# Patient Record
Sex: Male | Born: 1970 | Race: White | Hispanic: No | Smoking: Never smoker
Health system: Southern US, Community
[De-identification: ages and names within clinical notes are randomized; demographics above are authoritative.]

## PROBLEM LIST (undated history)

## (undated) DIAGNOSIS — K429 Umbilical hernia without obstruction or gangrene: Secondary | ICD-10-CM

## (undated) DIAGNOSIS — B1920 Unspecified viral hepatitis C without hepatic coma: Secondary | ICD-10-CM

## (undated) DIAGNOSIS — J309 Allergic rhinitis, unspecified: Secondary | ICD-10-CM

## (undated) HISTORY — PX: INCISION AND DRAINAGE: SHX5863

## (undated) HISTORY — PX: TENDON REPAIR: SHX5111

## (undated) HISTORY — PX: APPENDECTOMY: SHX54

## (undated) HISTORY — PX: OTHER SURGICAL HISTORY: SHX169

## (undated) HISTORY — PX: SHOULDER ARTHROSCOPY: SHX128

## (undated) HISTORY — DX: Hemochromatosis, unspecified: E83.119

---

## 2021-06-01 ENCOUNTER — Other Ambulatory Visit: Payer: Self-pay

## 2021-06-01 ENCOUNTER — Emergency Department (HOSPITAL_COMMUNITY)

## 2021-06-01 ENCOUNTER — Encounter (HOSPITAL_COMMUNITY): Payer: Self-pay | Admitting: *Deleted

## 2021-06-01 ENCOUNTER — Emergency Department (HOSPITAL_COMMUNITY)
Admission: EM | Admit: 2021-06-01 | Discharge: 2021-06-01 | Attending: Emergency Medicine | Admitting: Emergency Medicine

## 2021-06-01 DIAGNOSIS — W208XXA Other cause of strike by thrown, projected or falling object, initial encounter: Secondary | ICD-10-CM | POA: Insufficient documentation

## 2021-06-01 DIAGNOSIS — Y990 Civilian activity done for income or pay: Secondary | ICD-10-CM | POA: Diagnosis not present

## 2021-06-01 DIAGNOSIS — S60222A Contusion of left hand, initial encounter: Secondary | ICD-10-CM

## 2021-06-01 DIAGNOSIS — S6992XA Unspecified injury of left wrist, hand and finger(s), initial encounter: Secondary | ICD-10-CM | POA: Diagnosis present

## 2021-06-01 HISTORY — DX: Umbilical hernia without obstruction or gangrene: K42.9

## 2021-06-01 HISTORY — DX: Unspecified viral hepatitis C without hepatic coma: B19.20

## 2021-06-01 HISTORY — DX: Allergic rhinitis, unspecified: J30.9

## 2021-06-01 MED ORDER — IBUPROFEN 800 MG PO TABS
800.0000 mg | ORAL_TABLET | Freq: Once | ORAL | Status: AC
Start: 2021-06-01 — End: 2021-06-01
  Administered 2021-06-01: 800 mg via ORAL
  Filled 2021-06-01: qty 1

## 2021-06-01 NOTE — ED Triage Notes (Signed)
Left hand swelling, dropped heavy object on hand yesterday ?

## 2021-06-01 NOTE — ED Provider Notes (Signed)
?[facility] EMERGENCY DEPARTMENT ?Provider Note ? ? ?CSN: [id number] ?Arrival date & time: 06/01/21  1606 ? ?  ? ?History ? ?Chief Complaint  ?Patient presents with  ? Hand Injury  ? ? ?Brydon Spahr is a 51 y.o. male. ? ? ?Hand Injury ? ?  ? ? ?Dijuan Sleeth is a 52 y.o. male who is currently an inmate at the local work farm.  He presents to the Emergency Department complaining of injury of the left hand that occurred 1 day prior to arrival.  He states that a heavy piece of equipment fell onto his hand.  He reports swelling mainly on the dorsal surface of his hand and pain along his index finger.  He has increased pain with movement of his fingers and with gripping.  He denies any numbness of his hand or fingers.  No open wounds or injuries of the fingernails.  The left wrist is nontender. ? ? ?Home Medications ?Prior to Admission medications   ?Not on File  ?   ? ?Allergies    ?Keflex [cephalexin]   ? ?Review of Systems   ?Review of Systems  ?Musculoskeletal:  Positive for arthralgias (Left hand pain and swelling).  ?Neurological:  Negative for weakness and numbness.  ?All other systems reviewed and are negative. ? ?Physical Exam ?Updated Vital Signs ?BP 127/76   Pulse 61   Temp 98.2 ?F (36.8 ?C)   Resp 20   Ht 5\' 10"  (1.778 m)   Wt 99.8 kg   SpO2 98%   BMI 31.57 kg/m?  ?Physical Exam ?Vitals and nursing note reviewed.  ?Constitutional:   ?   General: He is not in acute distress. ?   Appearance: Normal appearance. He is not ill-appearing.  ?Cardiovascular:  ?   Rate and Rhythm: Normal rate and regular rhythm.  ?   Pulses: Normal pulses.  ?Pulmonary:  ?   Effort: Pulmonary effort is normal.  ?Musculoskeletal:     ?   General: Swelling, tenderness and signs of injury present. No deformity.  ?   Comments: Diffuse tenderness to palpation of the dorsal aspect of the left hand.  There is some tenderness at the DIP joint of the the left index finger.  No bony deformities.  No injury of the finger nail.  Left wrist  is nontender  ?Skin: ?   General: Skin is warm.  ?   Capillary Refill: Capillary refill takes less than 2 seconds.  ?   Findings: No bruising or erythema.  ?Neurological:  ?   General: No focal deficit present.  ?   Mental Status: He is alert.  ?   Sensory: No sensory deficit.  ?   Motor: No weakness.  ? ? ?ED Results / Procedures / Treatments   ?Labs ?(all labs ordered are listed, but only abnormal results are displayed) ?Labs Reviewed - No data to display ? ?EKG ?None ? ?Radiology ?DG Hand Complete Left ? ?Result Date: 06/01/2021 ?CLINICAL DATA:  Swelling and injury. EXAM: LEFT HAND - COMPLETE 3+ VIEW COMPARISON:  None. FINDINGS: There is diffuse soft tissue swelling of the hand. There is no acute fracture or dislocation identified. There is a small well corticated density along the lateral base of the first metacarpal head likely related to old injury. There is no radiopaque foreign body. Joint spaces are maintained. IMPRESSION: 1. Small density adjacent to the head of the first metacarpal is likely related to old injury. Please correlate for point tenderness. 2. No definite acute fracture or  dislocation. 3. Diffuse soft tissue swelling. Electronically Signed   By: Darliss Cheney M.D.   On: 06/01/2021 16:54   ? ?Procedures ?Procedures  ? ? ?Medications Ordered in ED ?Medications  ?ibuprofen (ADVIL) tablet 800 mg (has no administration in time range)  ? ? ?ED Course/ Medical Decision Making/ A&P ?  ?                        ?Medical Decision Making ?Risk ?Prescription drug management. ? ? ?Patient here for evaluation of injury to the left hand that occurred 1 day prior to arrival.  He has some diffuse tenderness in edema noted of the dorsal hand.  No bony deformities.  There are no subungual hematomas or other injury of the nails.  X-ray of the hand shows soft tissue swelling without evidence of fracture or dislocation.  Compartments are soft.  Neurovascularly intact. ? ?Injuries likely related to contusion,  discussed findings with the patient he is agreeable to symptomatic treatment with bulky dressing applied for support and RICE therapy he will follow-up closely with orthopedics if needed.  Patient appropriate for discharge. ? ? ? ? ? ? ? ?Final Clinical Impression(s) / ED Diagnoses ?Final diagnoses:  ?Contusion of left hand, initial encounter  ? ? ?Rx / DC Orders ?ED Discharge Orders   ? ? None  ? ?  ? ? ?  ?Pauline Aus, PA-C ?06/02/21 2347 ? ?  ?Derwood Kaplan, MD ?06/03/21 2044 ? ?

## 2021-06-01 NOTE — Discharge Instructions (Signed)
The x-ray of your hand did not show evidence of fractures.  I recommend that you elevate your hand when possible and apply ice packs on and off for the next 5 to 7 days or until the swelling improves.  You will need to follow-up with Dr. Mort Sawyers office in 1 week if your symptoms or not improving.  Please give him ibuprofen, 800 mg up to 3 times per day with food as needed for pain ?

## 2021-06-10 ENCOUNTER — Encounter: Payer: Self-pay | Admitting: Orthopedic Surgery

## 2021-06-10 ENCOUNTER — Ambulatory Visit (INDEPENDENT_AMBULATORY_CARE_PROVIDER_SITE_OTHER): Admitting: Orthopedic Surgery

## 2021-06-10 VITALS — BP 132/72 | HR 63 | Ht 70.0 in | Wt 225.0 lb

## 2021-06-10 DIAGNOSIS — S60222A Contusion of left hand, initial encounter: Secondary | ICD-10-CM

## 2021-06-10 DIAGNOSIS — S4492XA Injury of unspecified nerve at shoulder and upper arm level, left arm, initial encounter: Secondary | ICD-10-CM | POA: Insufficient documentation

## 2021-06-10 NOTE — Progress Notes (Signed)
Chief Complaint  ?Patient presents with  ? Hand Injury  ?  Lt hand DOI 05/31/21  ? ? 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  He presents with severe pain and swelling on the dorsum of his hand and decreased range of motion of his fingers with numbness in his thumb index and long finger ? ?He went to the ER the x-rays were negative my independent review is pending below ? ?Past Medical History:  ?Diagnosis Date  ? Allergic rhinitis   ? Hepatitis C   ? Umbilical hernia   ? ? ?BP 132/72   Pulse 63   Ht 5\' 10"  (1.778 m)   Wt 225 lb (102.1 kg)   BMI 32.28 kg/m?  ? ? ?General appearance: Well-developed well-nourished no gross deformities ? ?Cardiovascular normal pulse and perfusion normal color without edema ? ?Neurologically no sensation loss or deficits or pathologic reflexes ? ?Psychological: Awake alert and oriented x3 mood and affect normal ? ?Skin no lacerations or ulcerations no nodularity no palpable masses, no erythema or nodularity ? ?Musculoskeletal: His left hand is severely swollen there is no sign of compartment syndrome but he has pain when he moves his fingers he has decreased sensation in the thumb index and long finger only on the dorsum of the hand he has decreased range of motion tenderness is also noted on the dorsal portion of the hand ? ?Imaging plain films of the hand I have reviewed independently and I read the x-ray as 3 views left hand no fracture dislocation just soft tissue swelling ? ?A/P ? ? ?Encounter Diagnoses  ?Name Primary?  ? 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 Contusion of left hand, initial encounter   ? ?Recommend wrap to control and decrease swelling ? ?Ibuprofen ? ?Occupational Therapy with passive and active assisted range of motion ? ?Follow-up in 4 to 6 weeks ?

## 2021-06-10 NOTE — Patient Instructions (Signed)
Edema glove  ? ?Salt soaks 3 x a day  ? ?Occupational therapy 3 x a week for edema control and AAROM and PROM  ? ?Ibuprofen 800 mg 3 x a day  ?

## 2021-06-22 ENCOUNTER — Telehealth: Payer: Self-pay | Admitting: Orthopedic Surgery

## 2021-06-22 NOTE — Telephone Encounter (Signed)
Called to discuss patient's physical therapy. 3 x a week Active Assisted ROM and Passive ROM ?

## 2021-06-22 NOTE — Telephone Encounter (Signed)
Please call Miss Hurdle at [facility] back, she needs clarification on the Physical Therapy.  Her number is [phone] she will be there till 2:00 today.  ?

## 2021-06-23 NOTE — Telephone Encounter (Signed)
Spoke with Ricky Christian and advised her therapy for 4 weeks to begin with. She stated she is trying to get it approved. She doesn't know if they can do 3 x a week but they will see. Gave her the address and phone number for [facility] ?

## 2021-07-05 ENCOUNTER — Ambulatory Visit (INDEPENDENT_AMBULATORY_CARE_PROVIDER_SITE_OTHER): Admitting: Orthopedic Surgery

## 2021-07-05 DIAGNOSIS — S4492XA Injury of unspecified nerve at shoulder and upper arm level, left arm, initial encounter: Secondary | ICD-10-CM | POA: Diagnosis not present

## 2021-07-05 DIAGNOSIS — S60222D Contusion of left hand, subsequent encounter: Secondary | ICD-10-CM

## 2021-07-05 NOTE — Progress Notes (Signed)
Chief Complaint  ?Patient presents with  ? Hand Pain  ?  Left- still hurts in the two fingers index and long, hasn't gone to therapy been trying to do some on his own ?  ? ? ?Encounter Diagnoses  ?Name Primary?  ? 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 Contusion of left hand, subsequent encounter   ? ? 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 ? ?He still has stiffness in the left hand mainly involving the extension of the MP and PIP joints with pain and swelling on the dorsum of the hand ? ?The patient needs to have occupational therapy. ? ?Once he has had 6 weeks of therapy he can come back for recheck if he has not had the therapy there is no need to bring him back ?

## 2021-07-05 NOTE — Patient Instructions (Addendum)
Arrange follow up after 6 weeks of therapy completed  ?

## 2021-07-21 ENCOUNTER — Telehealth: Payer: Self-pay

## 2021-07-21 NOTE — Telephone Encounter (Signed)
Ms. Ricky Christian with [facility] called stating she had ?been trying to get appointment with [facility] for OT. Stated she was told by Harriett Sine ?that they don't have an order from Dr. Romeo Apple. In his chart I see where  ?Dr. Romeo Apple did want OT setup for him. ? ?Please advise ? ?Ms. Hurtle [phone] ?

## 2021-07-22 ENCOUNTER — Other Ambulatory Visit: Payer: Self-pay

## 2021-07-22 DIAGNOSIS — S4492XA Injury of unspecified nerve at shoulder and upper arm level, left arm, initial encounter: Secondary | ICD-10-CM

## 2021-07-22 NOTE — Telephone Encounter (Signed)
Called to speak with Ms. Hurtle at the prison. An officer answered and stated she was away. Will try again later.  ?

## 2021-07-23 NOTE — Telephone Encounter (Signed)
Called x 2. Rings one time and then gives busy signal.  ?

## 2021-07-23 NOTE — Telephone Encounter (Signed)
Called prison and tried to speak with Ricky Christian. They stated she was gone for the day. Will try again Monday ?

## 2021-07-27 NOTE — Telephone Encounter (Signed)
Spoke with Ricky Christian at prison, physical therapy has been set up for patient. She is aware provider wants to follow up with patient after 6 wks PT for hand ?

## 2021-07-30 ENCOUNTER — Ambulatory Visit (HOSPITAL_COMMUNITY): Attending: Orthopedic Surgery | Admitting: Occupational Therapy

## 2021-07-30 ENCOUNTER — Encounter (HOSPITAL_COMMUNITY): Payer: Self-pay | Admitting: Occupational Therapy

## 2021-07-30 DIAGNOSIS — R29898 Other symptoms and signs involving the musculoskeletal system: Secondary | ICD-10-CM | POA: Diagnosis present

## 2021-07-30 DIAGNOSIS — R600 Localized edema: Secondary | ICD-10-CM | POA: Insufficient documentation

## 2021-07-30 DIAGNOSIS — M79642 Pain in left hand: Secondary | ICD-10-CM | POA: Diagnosis present

## 2021-07-30 NOTE — Therapy (Signed)
OUTPATIENT OCCUPATIONAL THERAPY ORTHO EVALUATION  Patient Name: Ricky Christian MRN: [id number] DOB:09-17-1970, 51 y.o., male Today's Date: 07/30/2021  PCP: none REFERRING PROVIDER: Dr. Fuller Canada   OT End of Session - 07/30/21 1340     Visit Number 1    Number of Visits 1    Date for OT Re-Evaluation 08/02/21    Authorization Type [place] [employer]/STATE [employer]    OT Start Time 1302    OT Stop Time 1320    OT Time Calculation (min) 18 min    Activity Tolerance Patient tolerated treatment well    Behavior During Therapy WFL for tasks assessed/performed             Past Medical History:  Diagnosis Date   Allergic rhinitis    Hepatitis C    Umbilical hernia    History reviewed. No pertinent surgical history. 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 Is a 51 year old male . Presenting with left hand pain and swelling that began approx. 2-3 months ago due to a work injury. Pt. Was referred to occupational therapy for evaluation and treatment by Dr. Romeo Apple.   PRECAUTIONS: None  WEIGHT BEARING RESTRICTIONS No  PAIN:  Are you having pain? Yes: NPRS scale: 2/10 Pain location: left hand Pain description: aching  Aggravating factors: over use of hand Relieving factors: ice   FALLS: Has patient fallen in last 6 months? No  LIVING ENVIRONMENT: Lives with: pt. Currently resides at the [facility]  PLOF: Independent  PATIENT  GOALS to go back to work   OBJECTIVE:   HAND DOMINANCE: Right  ADLs: Overall ADLs: Pt. Is having difficulty with gripping, lifting heavy objects, and performing work tasks.    UPPER EXTREMITY ROM     Pt. Is able to make a full fist.  Active ROM Right eval Left eval  Wrist flexion  68  Wrist extension  65  Wrist ulnar deviation  32  Wrist radial deviation  32  Wrist pronation  90  Wrist supination  90  (Blank rows = not tested)     UPPER EXTREMITY MMT:     MMT Right eval Left eval  Wrist flexion  5/5  Wrist extension  5/5  Wrist ulnar deviation  5/5  Wrist radial deviation  5/5  Wrist pronation  5/5  Wrist supination  5/5  (Blank rows = not tested)  HAND FUNCTION: Grip strength: Right: 75 lbs; Left: 40 lbs, Lateral pinch: Right: 25 lbs, Left: 17 lbs, and 3 point pinch: Right: 22 lbs, Left: 20 lbs    EDEMA: mild edema noted throughout digits and dorsal hand   COGNITION: Overall cognitive status: Within functional limits for tasks assessed   PATIENT EDUCATION: Education details: Higher education careers adviser and digit AROM Person educated: Patient Education method: Explanation, Demonstration, and Handouts Education comprehension: verbalized understanding and returned demonstration   HOME EXERCISE PROGRAM: Eval. Hand and digit ROM   ASSESSMENT:  CLINICAL IMPRESSION: Patient is a 51  y.o. male who was seen today for occupational therapy evaluation for left hand pain and swelling. Pain and swelling has been present for 2-3 months. Reports that swelling has improved since onset of injury. During evaluation mild swelling noted throughout the hand. Pt. With ROM and strength WNL in wrist, hand strength decreased in comparison to dominant hand. After passive stretching, pt. Able to make full fist. Provided small sized edema glove for night time use. Pt. Provided with ROM and strengthening exercises to complete as HEP. No further OT services needed at this time.   PERFORMANCE DEFICITS  in functional skills including IADLs, edema, strength, pain, and UE functional use.  IMPAIRMENTS are limiting patient from ADLs and work.   COMORBIDITIES has no other co-morbidities that affects occupational performance. Patient will benefit from skilled OT to address above impairments and improve overall function.  MODIFICATION OR ASSISTANCE TO COMPLETE EVALUATION: No modification of tasks or assist necessary to complete an evaluation.  OT OCCUPATIONAL PROFILE AND HISTORY: Problem focused assessment: Including review of records relating to presenting problem.  CLINICAL DECISION MAKING: LOW - limited treatment options, no task modification necessary  REHAB POTENTIAL: Good  EVALUATION COMPLEXITY: Low      PLAN: OT FREQUENCY: one time visit  OT DURATION: 1 week  PLANNED INTERVENTIONS: patient/family education  CONSULTED AND AGREED WITH PLAN OF CARE: Patient  PLAN FOR NEXT SESSION: no further OT services required. Pt. Agreeable to HEP. If needs to return in the future, a new referral will be required.   Ezra Sites, OTR/L  [phone] 07/30/21

## 2021-07-30 NOTE — Patient Instructions (Signed)
Complete each exercise 10-15X, 2-3X/day  1) Towel crunch Place a small towel on a firm table top. Flatten out the towel and then place your hand on one end of it.  Next, flex your fingers 2-5 (index finger through pinky finger) as you pull the towel towards your hand.    2) Digit composite flexion/adduction (make a fist) Hold your hand up as shown. Open and close your hand into a fist and repeat. If you cannot make a full fist, then make a partial fist.    3) Thumb/finger opposition Touch the tip of the thumb to each fingertip one by one. Extend fingers fully after they are touched.      4) Finger Taps Start with the hand flat and fingers slightly spread.  One at a time, starting with the thumb, lift each finger up separately.     5) Digit Abduction/Adduction Hold hand palm down flat on table. Spread your fingers apart and back together.   

## 2021-08-26 ENCOUNTER — Emergency Department (HOSPITAL_COMMUNITY)
Admission: EM | Admit: 2021-08-26 | Discharge: 2021-08-26 | Disposition: A | Discharge status: Home / Self Care | Attending: Student | Admitting: Student

## 2021-08-26 ENCOUNTER — Emergency Department (HOSPITAL_COMMUNITY)

## 2021-08-26 ENCOUNTER — Encounter (HOSPITAL_COMMUNITY): Payer: Self-pay | Admitting: *Deleted

## 2021-08-26 ENCOUNTER — Other Ambulatory Visit: Payer: Self-pay

## 2021-08-26 DIAGNOSIS — D61818 Other pancytopenia: Secondary | ICD-10-CM | POA: Diagnosis not present

## 2021-08-26 DIAGNOSIS — R079 Chest pain, unspecified: Secondary | ICD-10-CM | POA: Diagnosis present

## 2021-08-26 DIAGNOSIS — R040 Epistaxis: Secondary | ICD-10-CM | POA: Insufficient documentation

## 2021-08-26 DIAGNOSIS — R519 Headache, unspecified: Secondary | ICD-10-CM | POA: Insufficient documentation

## 2021-08-26 DIAGNOSIS — R739 Hyperglycemia, unspecified: Secondary | ICD-10-CM | POA: Insufficient documentation

## 2021-08-26 DIAGNOSIS — M7981 Nontraumatic hematoma of soft tissue: Secondary | ICD-10-CM | POA: Diagnosis not present

## 2021-08-26 DIAGNOSIS — R350 Frequency of micturition: Secondary | ICD-10-CM | POA: Diagnosis not present

## 2021-08-26 LAB — URINALYSIS, ROUTINE W REFLEX MICROSCOPIC
Bilirubin Urine: NEGATIVE
Glucose, UA: NEGATIVE mg/dL
Hgb urine dipstick: NEGATIVE
Ketones, ur: NEGATIVE mg/dL
Leukocytes,Ua: NEGATIVE
Nitrite: NEGATIVE
Protein, ur: NEGATIVE mg/dL
Specific Gravity, Urine: 1.008 (ref 1.005–1.030)
pH: 7 (ref 5.0–8.0)

## 2021-08-26 LAB — CBC WITH DIFFERENTIAL/PLATELET
Abs Immature Granulocytes: 0 10*3/uL (ref 0.00–0.07)
Basophils Absolute: 0 10*3/uL (ref 0.0–0.1)
Basophils Relative: 1 %
Eosinophils Absolute: 0.1 10*3/uL (ref 0.0–0.5)
Eosinophils Relative: 4 %
HCT: 37.4 % — ABNORMAL LOW (ref 39.0–52.0)
Hemoglobin: 12.5 g/dL — ABNORMAL LOW (ref 13.0–17.0)
Immature Granulocytes: 0 %
Lymphocytes Relative: 30 %
Lymphs Abs: 1.2 10*3/uL (ref 0.7–4.0)
MCH: 30.3 pg (ref 26.0–34.0)
MCHC: 33.4 g/dL (ref 30.0–36.0)
MCV: 90.6 fL (ref 80.0–100.0)
Monocytes Absolute: 0.4 10*3/uL (ref 0.1–1.0)
Monocytes Relative: 10 %
Neutro Abs: 2.2 10*3/uL (ref 1.7–7.7)
Neutrophils Relative %: 55 %
Platelets: 116 10*3/uL — ABNORMAL LOW (ref 150–400)
RBC: 4.13 MIL/uL — ABNORMAL LOW (ref 4.22–5.81)
RDW: 13.2 % (ref 11.5–15.5)
WBC: 3.9 10*3/uL — ABNORMAL LOW (ref 4.0–10.5)
nRBC: 0 % (ref 0.0–0.2)

## 2021-08-26 LAB — COMPREHENSIVE METABOLIC PANEL
ALT: 52 U/L — ABNORMAL HIGH (ref 0–44)
AST: 45 U/L — ABNORMAL HIGH (ref 15–41)
Albumin: 3.8 g/dL (ref 3.5–5.0)
Alkaline Phosphatase: 72 U/L (ref 38–126)
Anion gap: 7 (ref 5–15)
BUN: 20 mg/dL (ref 6–20)
CO2: 21 mmol/L — ABNORMAL LOW (ref 22–32)
Calcium: 8.4 mg/dL — ABNORMAL LOW (ref 8.9–10.3)
Chloride: 109 mmol/L (ref 98–111)
Creatinine, Ser: 0.87 mg/dL (ref 0.61–1.24)
GFR, Estimated: >60 mL/min (ref >60)
Glucose, Bld: 103 mg/dL — ABNORMAL HIGH (ref 70–99)
Potassium: 3.9 mmol/L (ref 3.5–5.1)
Sodium: 137 mmol/L (ref 135–145)
Total Bilirubin: 0.6 mg/dL (ref 0.3–1.2)
Total Protein: 6.9 g/dL (ref 6.5–8.1)

## 2021-08-26 LAB — TROPONIN I (HIGH SENSITIVITY)
Troponin I (High Sensitivity): 2 ng/L (ref <18)
Troponin I (High Sensitivity): 2 ng/L (ref <18)

## 2021-08-26 LAB — IRON AND TIBC
Iron: 132 ug/dL (ref 45–182)
Saturation Ratios: 39 % (ref 17.9–39.5)
TIBC: 336 ug/dL (ref 250–450)
UIBC: 204 ug/dL

## 2021-08-26 LAB — FERRITIN: Ferritin: 58 ng/mL (ref 24–336)

## 2021-08-26 LAB — CBG MONITORING, ED: Glucose-Capillary: 96 mg/dL (ref 70–99)

## 2021-08-26 LAB — PROTIME-INR
INR: 1.1 (ref 0.8–1.2)
Prothrombin Time: 14.4 seconds (ref 11.4–15.2)

## 2021-08-26 LAB — APTT: aPTT: 22 seconds — ABNORMAL LOW (ref 24–36)

## 2021-08-26 NOTE — Discharge Instructions (Signed)
Your work-up here was reassuring.  Would like for you to follow-up with your PCP if you have one.  I have provided you with a number for a primary care doctor if you do not.  I would like for you to return to the emergency department if you have any worsening symptoms.

## 2021-08-26 NOTE — ED Triage Notes (Signed)
Pt brought in by [facility] for c/o hyperglycemia; pt is c/o pain to left side of head  Pt has c/o chest pain  Cbg 395  Pt given NS en route by ems  Pt having bilateral lower leg edema with new rash to bilateral lower legs  Pt c/o left side chest pain that radiates to his left arm x couple days; pt states he has some sob with exertion

## 2021-08-26 NOTE — ED Provider Notes (Signed)
[facility] EMERGENCY DEPARTMENT Provider Note   CSN: [id number] Arrival date & time: 08/26/21  1016     History  Chief Complaint  Patient presents with   Hyperglycemia    Ricky Christian is a 51 y.o. male patient with history of hemochromatosis who presents to the emergency department with a 2 to 3-day history of polyuria, polydipsia, and polyphagia.  Patient does not have a history of diabetes.  He also mentions that over the last 2 to 3 weeks he has been noticing increased frequency and nosebleeds which is abnormal for him in addition to some lower leg swelling and ecchymosis.  He denies any trauma or injury to the legs.  No chest pain or shortness of breath.  Patient also states that he is having some pain over the left forehead which was an old traumatic wound is now become increasingly more painful over the last 2 weeks.  Denies any urinary complaints.   Hyperglycemia      Home Medications Prior to Admission medications   Not on File      Allergies    Keflex [cephalexin]    Review of Systems   Review of Systems  All other systems reviewed and are negative.   Physical Exam Updated Vital Signs BP 125/84   Pulse (!) 54   Temp 98 F (36.7 C) (Oral)   Resp 14   Ht 5\' 10"  (1.778 m)   Wt 101.6 kg   SpO2 99%   BMI 32.14 kg/m  Physical Exam Vitals and nursing note reviewed.  Constitutional:      General: He is not in acute distress.    Appearance: Normal appearance.  HENT:     Head: Normocephalic and atraumatic.  Eyes:     General:        Right eye: No discharge.        Left eye: No discharge.  Cardiovascular:     Comments: Regular rate and rhythm.  S1/S2 are distinct without any evidence of murmur, rubs, or gallops.  Radial pulses are 2+ bilaterally.  Dorsalis pedis pulses are 2+ bilaterally.  No evidence of pedal edema. Pulmonary:     Comments: Clear to auscultation bilaterally.  Normal effort.  No respiratory distress.  No evidence of wheezes, rales, or  rhonchi heard throughout. Abdominal:     General: Abdomen is flat. Bowel sounds are normal. There is no distension.     Tenderness: There is no abdominal tenderness. There is no guarding or rebound.  Musculoskeletal:        General: Normal range of motion.     Cervical back: Neck supple.  Skin:    General: Skin is warm and dry.     Findings: No rash.  Neurological:     General: No focal deficit present.     Mental Status: He is alert.  Psychiatric:        Mood and Affect: Mood normal.        Behavior: Behavior normal.     ED Results / Procedures / Treatments   Labs (all labs ordered are listed, but only abnormal results are displayed) Labs Reviewed  CBC WITH DIFFERENTIAL/PLATELET - Abnormal; Notable for the following components:      Result Value   WBC 3.9 (*)    RBC 4.13 (*)    Hemoglobin 12.5 (*)    HCT 37.4 (*)    Platelets 116 (*)    All other components within normal limits  URINALYSIS, ROUTINE W REFLEX MICROSCOPIC -  Abnormal; Notable for the following components:   Color, Urine STRAW (*)    All other components within normal limits  APTT - Abnormal; Notable for the following components:   aPTT 22 (*)    All other components within normal limits  COMPREHENSIVE METABOLIC PANEL - Abnormal; Notable for the following components:   CO2 21 (*)    Glucose, Bld 103 (*)    Calcium 8.4 (*)    AST 45 (*)    ALT 52 (*)    All other components within normal limits  PROTIME-INR  IRON AND TIBC  FERRITIN  CBG MONITORING, ED  TROPONIN I (HIGH SENSITIVITY)  TROPONIN I (HIGH SENSITIVITY)    EKG None  Radiology CT Head Wo Contrast  Result Date: 08/26/2021 CLINICAL DATA:  51 year old male with history of new or worsening headache. EXAM: CT HEAD WITHOUT CONTRAST TECHNIQUE: Contiguous axial images were obtained from the base of the skull through the vertex without intravenous contrast. RADIATION DOSE REDUCTION: This exam was performed according to the departmental  dose-optimization program which includes automated exposure control, adjustment of the mA and/or kV according to patient size and/or use of iterative reconstruction technique. COMPARISON:  No priors. FINDINGS: Brain: No evidence of acute infarction, hemorrhage, hydrocephalus, extra-axial collection or mass lesion/mass effect. Vascular: No hyperdense vessel or unexpected calcification. Skull: Normal. Negative for fracture or focal lesion. Sinuses/Orbits: No acute finding. Other: None. IMPRESSION: 1. No acute intracranial abnormalities. The appearance of the brain is normal. Electronically Signed   By: Trudie Reed M.D.   On: 08/26/2021 11:00    Procedures Procedures    Medications Ordered in ED Medications - No data to display  ED Course/ Medical Decision Making/ A&P Clinical Course as of 08/26/21 1358  Thu Aug 26, 2021  1112 Urinalysis, Routine w reflex microscopic Urine, Clean Catch(!) No signs of infection. [CF]  1112 CT Head Wo Contrast I personally ordered and interpreted CT head without contrast and denies any evidence of intracranial hemorrhage today.  I do agree with the radiologist interpretation. [CF]  1351 Comprehensive metabolic panel(!) Glucose is slightly elevated.  There is some evidence of transaminitis. [CF]  1353 CBC with Differential(!) There is evidence of pancytopenia. [CF]  1353 Troponin I (High Sensitivity) Initial and delta troponin are negative. [CF]  1354 APTT(!) APTT below normal limits. [CF]  1354 Iron and TIBC Iron studies normal. [CF]  1354 Ferritin (Iron Binding Protein) Ferritin normal. [CF]  1354 Protime-INR PT/INR normal. [CF]    Clinical Course User Index [CF] Teressa Lower, PA-C                           Medical Decision Making Ricky Christian is a 51 y.o. male patient who presents to the emergency department today for further evaluation of bruising, bilateral leg swelling, chest pain, and epistaxis.  Given his history of hemochromatosis,  will be getting some blood work in addition to some iron studies.  We will also evaluate for hyperglycemia.  Given that he has been having some headaches with a history of head trauma we will also get a CT scan of the head.  Patient is in no acute distress at this time and is nontoxic-appearing.   Amount and/or Complexity of Data Reviewed External Data Reviewed: notes.    Details: No old records to review care today. Labs: ordered. Decision-making details documented in ED Course. Radiology: ordered and independent interpretation performed. Decision-making details documented in ED Course. ECG/medicine  tests: ordered and independent interpretation performed.    Details: No signs of active ischemia.  Sinus rhythm.  Risk Prescription drug management. Decision regarding hospitalization. Risk Details: Work-up was ultimately unrevealing.  Patient does have evidence of pancytopenia but he does not meet inpatient criteria today or qualify for emergent transfusions.  Patient's blood sugar is normal here.  No evidence of ACS today.  Provided him a number for primary care doctor.  Discussed the findings and results of the visit today with the patient the bedside.  All questions or concerns addressed.  Strict return precautions were discussed with him at the bedside.  He is safe discharge.   Final Clinical Impression(s) / ED Diagnoses Final diagnoses:  Pancytopenia (HCC)  Chest pain, unspecified type    Rx / DC Orders ED Discharge Orders     None         Teressa Lower, [place] 08/26/21 1358    Kommor, Wyn Forster, MD 08/27/21 [phone]

## 2021-09-28 ENCOUNTER — Telehealth: Payer: Self-pay | Admitting: Orthopedic Surgery

## 2021-09-28 NOTE — Telephone Encounter (Signed)
Per Dr Romeo Apple Once he has had 6 weeks of therapy he can come back for recheck if he has not had the therapy there is no need to bring him back.

## 2021-09-28 NOTE — Telephone Encounter (Signed)
Call received from [facility] farm, per Ms. Hurdle, ph# [phone], relaying that the occupational therapist saw patient for a one time evaluation and advised that patient did not need further therapy visits. Per Dr Mort Sawyers last note at time of visit 07/05/21, patient was to return after 6 weeks of therapy. Please advise.

## 2021-09-29 NOTE — Telephone Encounter (Signed)
Called back to [facility] facility *Correct phone number/general number: [phone] - medical department said Ricky Christian is gone for today and to please call back tomorrow morning per Dr Minda Ditto response.   (Refer Ricky Christian to speak with [place] out-patient therapy regarding in occupational therapy referral order)

## 2021-10-01 NOTE — Telephone Encounter (Signed)
Called back to speak with Ricky Christian yesterday, 10/01/22, missed her at two different times.  I called back this morning, and she is out of office. I faxed a note to [place] facility to call us, fax [phone] (ph [phone])

## 2021-10-04 NOTE — Telephone Encounter (Signed)
Ms Hurdle states [facility] therapist said patient did not need further visits. States she would fax [place] the note from therapy. I relayed as noted that Dr Romeo Apple wanted to see patient after completing 6 weeks of therapy.

## 2021-10-21 ENCOUNTER — Telehealth: Payer: Self-pay | Admitting: Radiology

## 2021-10-21 NOTE — Telephone Encounter (Signed)
Call from facility Received: Today Kathrene Alu, Jerrell Mangel W, RT Per call back from Miss Hurdle at [facility] facility, ph#[phone]; states per last communication that patient was advised by physical therapist that he did not need any further therapy. As previously noted, Dr Romeo Apple will see patient after 6 weeks of therapy has been completed. Miss Lindon Romp states she has faxed a copy of the therapist's notes, which are in Epic.  Please advise.

## 2021-10-21 NOTE — Telephone Encounter (Signed)
I ll see him aug 25

## 2021-10-21 NOTE — Telephone Encounter (Signed)
Faxed note to [facility] prison attention Miss Hurdle to call to schedule per Dr Mort Sawyers reply.

## 2021-11-05 ENCOUNTER — Ambulatory Visit (INDEPENDENT_AMBULATORY_CARE_PROVIDER_SITE_OTHER): Admitting: Orthopedic Surgery

## 2021-11-05 ENCOUNTER — Encounter: Payer: Self-pay | Admitting: Orthopedic Surgery

## 2021-11-05 DIAGNOSIS — S4492XA Injury of unspecified nerve at shoulder and upper arm level, left arm, initial encounter: Secondary | ICD-10-CM

## 2021-11-05 DIAGNOSIS — S60222D Contusion of left hand, subsequent encounter: Secondary | ICD-10-CM

## 2021-11-05 DIAGNOSIS — M24549 Contracture, unspecified hand: Secondary | ICD-10-CM

## 2021-11-05 NOTE — Progress Notes (Signed)
FOLLOW UP   Encounter Diagnoses  Name Primary?   Neurapraxia of left upper extremity, initial encounter Yes   Contusion of left hand, subsequent encounter      Chief Complaint  Patient presents with   Hand Pain    LT/ pt has had physical therapy/ has not improved/ still locking up and swelling    73 right-hand-dominant male follow-up  He is an inmate at [facility] prison  A large object fell onto the dorsum of his left hand on May 31, 2021 he presented in severe pain with decreased range of motion of his fingers and numbness in his thumb index and long finger  X-rays were negative  We ordered physical therapy with edema control however his therapy was delayed.  He finally did get physical therapy presents back for evaluation  He still has significant swelling and stiffness of the thumb index long and ring finger.  The small finger is somewhat spared  As the injury occurred on the dorsum of the hand his flexion is diminished in terms of active range of motion.   Encounter Diagnoses  Name Primary?   Neurapraxia of left upper extremity, initial encounter Yes   Contusion of left hand, subsequent encounter    Contracture of hand

## 2021-11-11 ENCOUNTER — Telehealth: Payer: Self-pay | Admitting: Radiology

## 2021-11-11 NOTE — Telephone Encounter (Signed)
I have called Ms Ricky Christian at the prison to let her know patient needs to see a hand surgeon / her computer and fax are currently not working, she needs records faxed but doesn't know when her fax will be working, she will call me back and let me know.

## 2021-11-23 ENCOUNTER — Telehealth: Payer: Self-pay | Admitting: Radiology

## 2021-11-23 NOTE — Telephone Encounter (Signed)
Called but line is busy. I can't schedule this appointment Ms. Hurdle has to schedule, he is in prison. I will continue to call

## 2021-11-23 NOTE — Telephone Encounter (Signed)
Ms Ricky Christian called, said she was confused.  She called the [facility] office and asked to schedule an appt, was advised they don't have a hand specialist, but they scheduled an appt anyway for patient.  I looked, don't see a hand referral in chart.  Can you please enter one and call Ms Ricky Christian and help facilitate an appt with a hand surgeon?  Thanks.

## 2021-11-23 NOTE — Telephone Encounter (Signed)
I called Ms Ricky Christian to discuss Reminded her when I spoke to her previously we use [facility] or [facility] for hand She states [facility] will not see their patients and this patient can no longer be seen at [facility], she will call her nurse manager and find out what to do regarding the referral.

## 2021-11-24 ENCOUNTER — Telehealth: Payer: Self-pay | Admitting: Orthopedic Surgery

## 2021-11-24 NOTE — Telephone Encounter (Signed)
Call received from Miss Hurdle at [facility], ph [phone], asking for Amy to please return call; states has a few questions.

## 2021-11-24 NOTE — Telephone Encounter (Signed)
I called her back I left message with the deputy chief (voice mail) for her to call me back.

## 2022-01-06 ENCOUNTER — Ambulatory Visit (INDEPENDENT_AMBULATORY_CARE_PROVIDER_SITE_OTHER): Admitting: Gastroenterology

## 2022-01-06 ENCOUNTER — Encounter: Payer: Self-pay | Admitting: Gastroenterology

## 2022-01-06 ENCOUNTER — Telehealth: Payer: Self-pay | Admitting: *Deleted

## 2022-01-06 DIAGNOSIS — R109 Unspecified abdominal pain: Secondary | ICD-10-CM

## 2022-01-06 DIAGNOSIS — R112 Nausea with vomiting, unspecified: Secondary | ICD-10-CM

## 2022-01-06 DIAGNOSIS — K279 Peptic ulcer, site unspecified, unspecified as acute or chronic, without hemorrhage or perforation: Secondary | ICD-10-CM | POA: Diagnosis not present

## 2022-01-06 DIAGNOSIS — Z8619 Personal history of other infectious and parasitic diseases: Secondary | ICD-10-CM | POA: Diagnosis not present

## 2022-01-06 DIAGNOSIS — K5900 Constipation, unspecified: Secondary | ICD-10-CM | POA: Insufficient documentation

## 2022-01-06 MED ORDER — PANTOPRAZOLE SODIUM 40 MG PO TBEC: 40.0000 mg | DELAYED_RELEASE_TABLET | Freq: Every day | ORAL | 3 refills | Status: AC

## 2022-01-06 NOTE — Telephone Encounter (Signed)
Called [facility] and spoke with Ms. Hurdle in medical. She will need to get authorization for pt hida and egd. Once she gets this she will let me know.

## 2022-01-06 NOTE — Patient Instructions (Addendum)
For nausea, vomiting, history of ulcers, start taking pantoprazole once daily, 30 minutes before breakfast. This works best on an empty stomach.  For constipation: I have given samples called Linzess. Linzess works best when taken once a day every day, on an empty stomach, at least 30 minutes before your first meal of the day.  When Linzess is taken daily as directed:  *Constipation relief is typically felt in about a week *IBS-C patients may begin to experience relief from belly pain and overall abdominal symptoms (pain, discomfort, and bloating) in about 1 week,   with symptoms typically improving over 12 weeks.  Diarrhea may occur in the first 2 weeks -keep taking it.  The diarrhea should go away and you should start having normal, complete, full bowel movements. It may be helpful to start treatment when you can be near the comfort of your own bathroom, such as a weekend.    Please let the nurse know how Linzess is working for you in the next 2 weeks. I will send an actual prescription if this works well.   We are arranging a special test for your gallbladder called a HIDA scan.  We are also arranging an upper endoscopy as soon as possible with Dr. Abbey Chatters.  I will speak with the facility about Hep C treatment.   I recommend a low-fat diet.    It was a pleasure to see you today. I want to create trusting relationships with patients to provide genuine, compassionate, and quality care. I value your feedback. If you receive a survey regarding your visit,  I greatly appreciate you taking time to fill this out.   Annitta Needs, PhD, ANP-BC [facility]

## 2022-01-06 NOTE — Progress Notes (Signed)
Gastroenterology Office Note    Referring Provider: No ref. provider found Primary Care Physician:  Pcp, No  Primary GI: Dr. Marletta Lor     Chief Complaint   Chief Complaint  Patient presents with   Abdominal Pain    Throwing bitter foam up after he eats, feels bloated constantly. Been going on for about two and half months.     History of Present Illness   Ricky Christian is a 51 y.o. male presenting today at the request of PCP due to abdominal pain, nausea, and vomiting. He resides at the [facility].  2.5-3 months  ago started vomiting. Felt sick one night and started hurting. Got queasy and threw up foam that was btiter. Will have phlegm that is bitter come up. States in the past had been set for cholecystectomy but due to Covid was postponed.   RUQ pain and radiates into epigastric. Aggravating. Feels constantly bloated. Postprandial pain constantly, belching constantly. Hx of ulcer in 2018/2019 and was in ICU at [facility]. Had surgery for perforated ulcer. EGD done at [facility]. Belching. Has GERD. Weight fluctuates. Has pain even when not eating. Notes black stool if constipated.   Constipation: can go up to a week without BM. Will be given something to aid from the facility. Possible colonoscopy completed at [facility]. Notes not available.   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 2022 last used. He has been referred to the Hep C Committee that is part of the facility. I reviewed this with Nurse Okey Dupre after patient left. Hep C will be treated at the facility.   [place] Sept 2023: mild hepatomegaly and splenomegaly, 1.1 cm segment V lesion likely representing cyst or hamartoma.    Past Medical History:  Diagnosis Date   Allergic rhinitis    Hemochromatosis    per patient. Diagnosed in [place], [place]   Hepatitis C    Umbilical hernia     Past Surgical History:  Procedure Laterality Date   APPENDECTOMY     INCISION AND DRAINAGE Right     right hand   perforated ulcer surgery     2018/2019 at [place].   SHOULDER ARTHROSCOPY Right    TENDON REPAIR Right    right arm    Current Outpatient Medications  Medication Sig Dispense Refill   pantoprazole (PROTONIX) 40 MG tablet Take 1 tablet (40 mg total) by mouth daily. 30 minutes before breakfast 90 tablet 3   No current facility-administered medications for this visit.    Allergies as of 01/06/2022 - Review Complete 01/06/2022  Allergen Reaction Noted   Keflex [cephalexin]  06/01/2021    Family History  Problem Relation Age of Onset   Colon cancer Neg Hx    Colon polyps Neg Hx     Social History   Socioeconomic History   Marital status: Widowed    Spouse name: Not on file   Number of children: Not on file   Years of education: Not on file   Highest education level: Not on file  Occupational History   Not on file  Tobacco Use   Smoking status: Never   Smokeless tobacco: Never  Substance and Sexual Activity   Alcohol use: Not Currently   Drug use: Not Currently   Sexual activity: Not Currently  Other Topics Concern   Not on file  Social History Narrative   Not on file   Social Determinants of Health   Financial Resource  Strain: Not on file  Food Insecurity: Not on file  Transportation Needs: Not on file  Physical Activity: Not on file  Stress: Not on file  Social Connections: Not on file  Intimate Partner Violence: Not on file     Review of Systems   Gen: Denies any fever, chills, fatigue, weight loss, lack of appetite.  CV: Denies chest pain, heart palpitations, peripheral edema, syncope.  Resp: Denies shortness of breath at rest or with exertion. Denies wheezing or cough.  GI: see HPI GU : Denies urinary burning, urinary frequency, urinary hesitancy MS: Denies joint pain, muscle weakness, cramps, or limitation of movement.  Derm: Denies rash, itching, dry skin Psych: Denies depression, anxiety, memory loss, and confusion Heme: Denies  bruising, bleeding, and enlarged lymph nodes.   Physical Exam   BP 121/78 (BP Location: Right Arm, Patient Position: Sitting, Cuff Size: Large)   Pulse 72   Temp 97.6 F (36.4 C) (Oral)   Ht 5\' 10"  (1.778 m)   Wt 229 lb (103.9 kg)   SpO2 96%   BMI 32.86 kg/m  General:   Alert and oriented. Pleasant and cooperative. Well-nourished and well-developed.  Head:  Normocephalic and atraumatic. Eyes:  Without icterus Ears:  Normal auditory acuity. Lungs:  Clear to auscultation bilaterally.  Heart:  S1, S2 present without murmurs appreciated.  Abdomen:  +BS, soft, TTP RUQ and epigastric  and non-distended. No HSM noted. No guarding or rebound. No masses appreciated.  Rectal:  Deferred  Msk:  Symmetrical without gross deformities. Normal posture. Extremities:  Without edema. Neurologic:  Alert and  oriented x4;  grossly normal neurologically. Skin:  Intact without significant lesions or rashes. Psych:  Alert and cooperative. Normal mood and affect.   Assessment   Ricky Christian is a 51 y.o. male presenting today at the request of [facility] due to abdominal pain, N/V. He also has history of chronic GERD, constipation, and chronic Hepatitis C.   Abdominal pain, N/V: highly suspect biliary etiology in light of presentation. He reports cholecystectomy had been planned in past but postponed in light of Covid. Also notable is history of ulcer per patient in 2018/2019 with EGD done at [facility]; he reports surgery at that time for perforate ulcer. I do not have any of those notes available. Currently no PPI. Will start pantoprazole, arrange HIDA, and also pursue EGD as unable to exclude recurrent PUD. [place] Sept 2023: mild hepatomegaly and splenomegaly, 1.1 cm segment V lesion likely representing cyst or hamartoma. No gallstones.   Constipation: currently no regimen. Start Linzess 290 mcg daily. Samples provided.   History of Hepatitis C: per notes. I do not know the genotype. I did  confirm with the facility that he is in the process of evaluation by the Hepatitis C Committee at the facility. Hep C treatment to be completed by facility.       PLAN   Proceed with upper endoscopy by Dr. Abbey Chatters in near future: the risks, benefits, and alternatives have been discussed with the patient in detail. The patient states understanding and desires to proceed.  HIDA scan Pantoprazole daily Linzess 290 mcg samples provided   Annitta Needs, PhD, ANP-BC [facility]

## 2024-01-14 IMAGING — CT CT HEAD W/O CM
4 series · 17 of 47 positions shown, 19 images · non-contrast
Comparison: No priors.

CLINICAL DATA: 50-year-old male with history of new or worsening
headache.



[Series 2: head w o · axial · 0.47mm/px · z∈[+75,+195]mm · 7 of 34 slices shown, 9 images]
[im 5/34  brain]
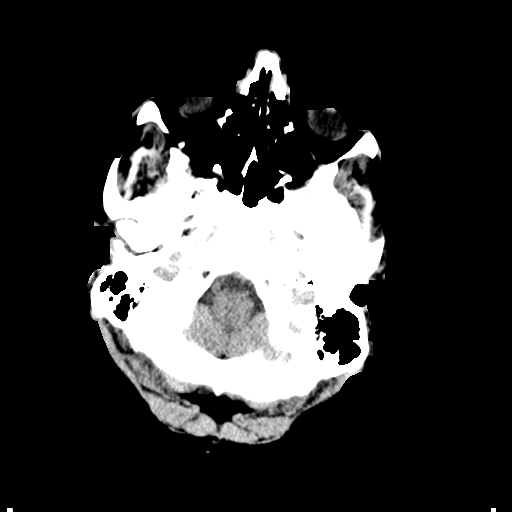
[im 5/34  bone]
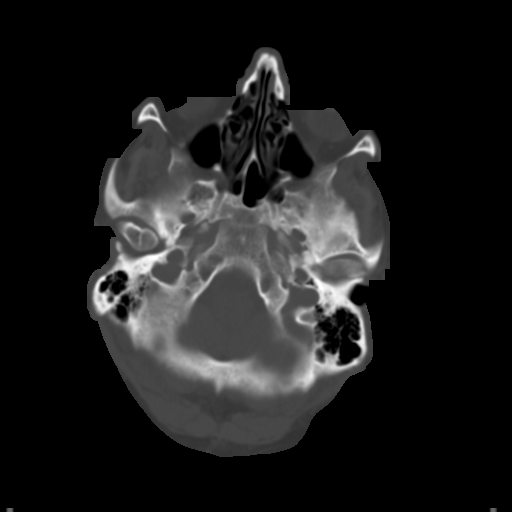
[im 9/34  brain]
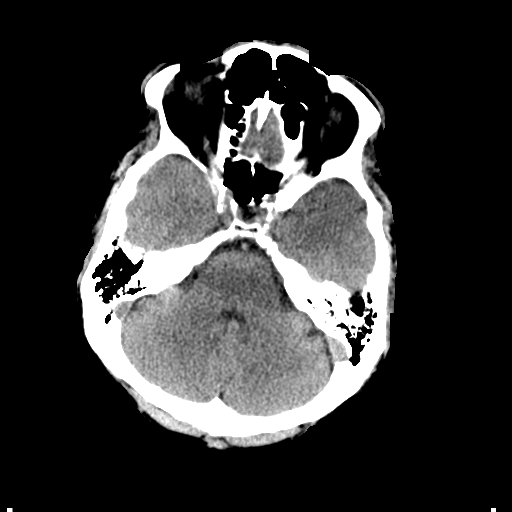
[im 13/34  brain]
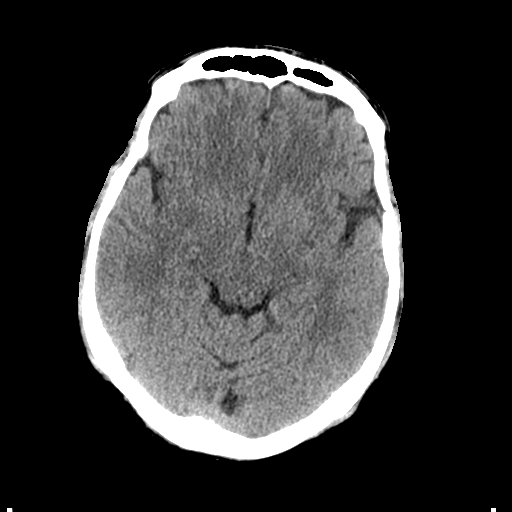
[im 17/34  brain]
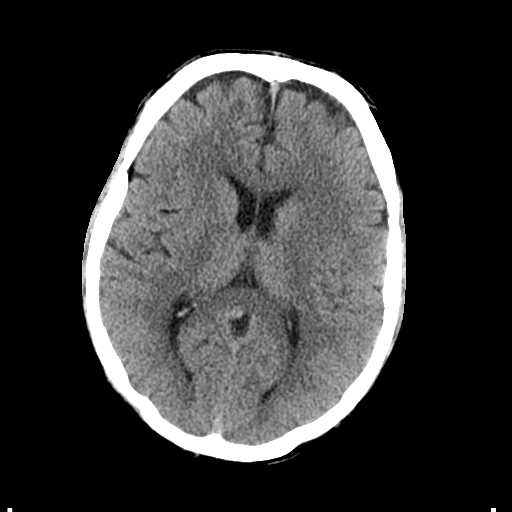
[im 21/34  brain]
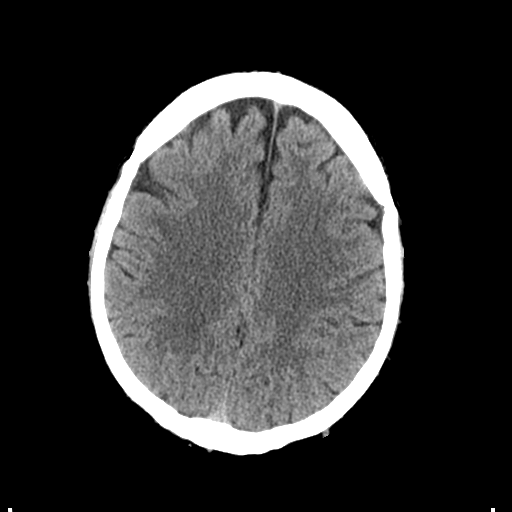
[im 21/34  bone]
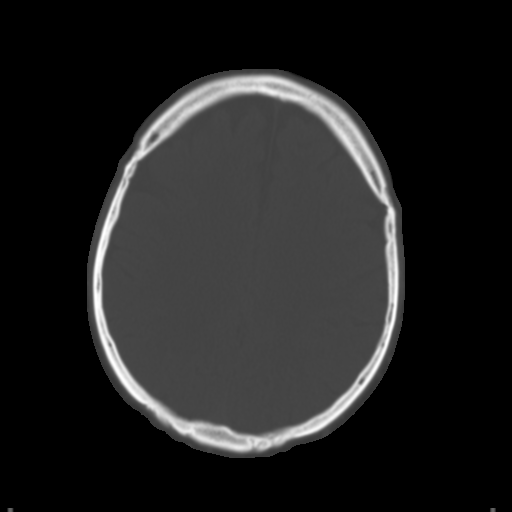
[im 25/34  brain]
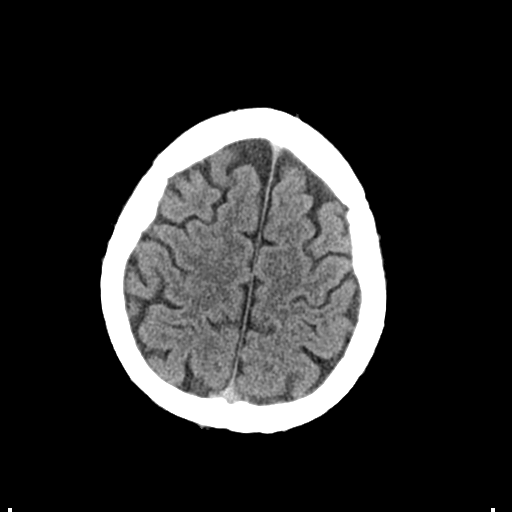
[im 29/34  brain]
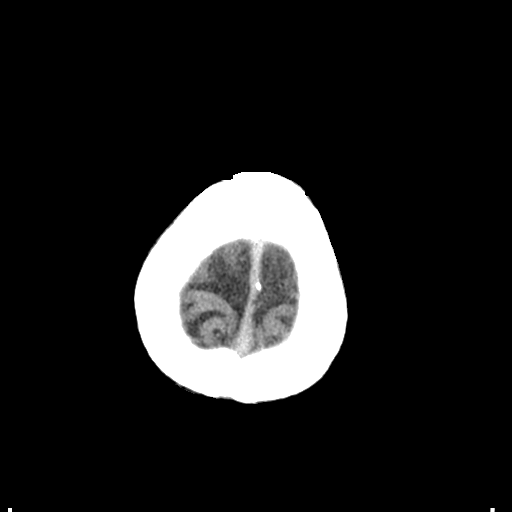

[Series 3: head bone · axial · 0.47mm/px · z∈[+71,+127]mm · 4 of 83 slices shown]
[im 9/83  bone]
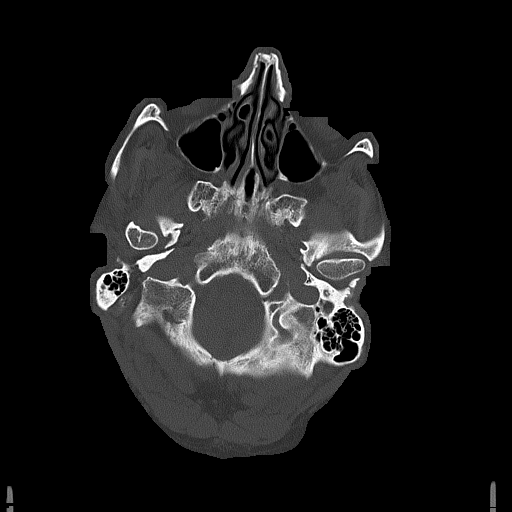
[im 17/83  bone]
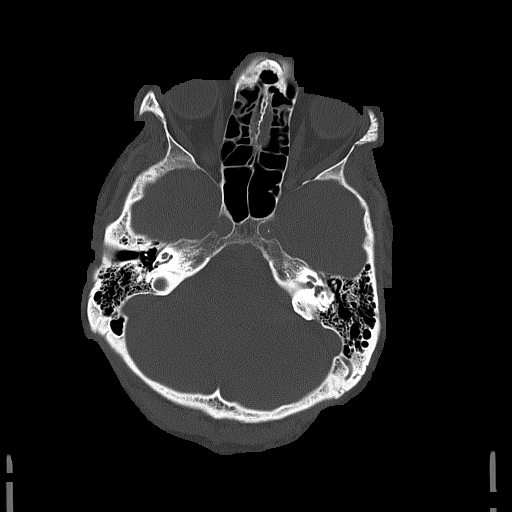
[im 25/83  bone]
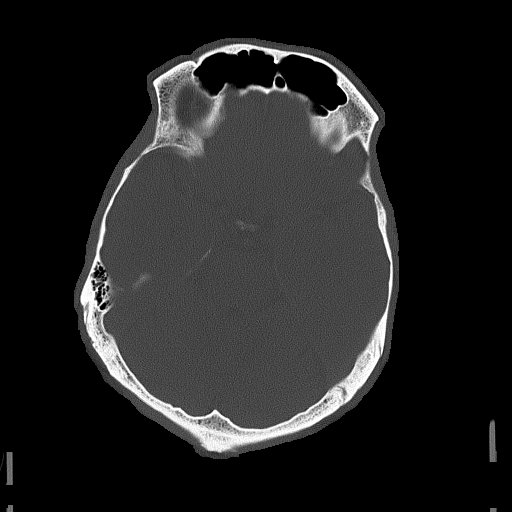
[im 37/83  bone]
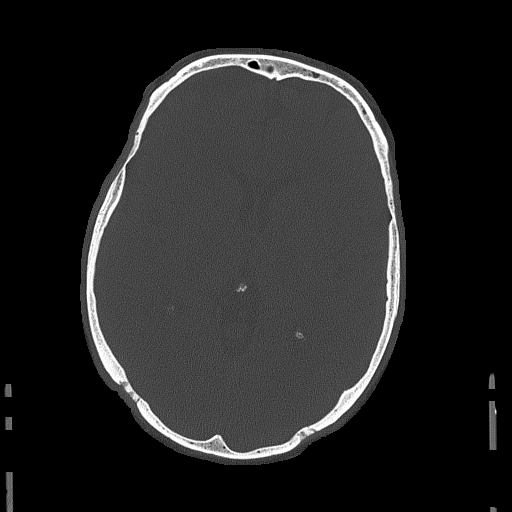

[Series 4: coronal soft · coronal · 0.35mm/px · 3 of 74 slices shown]
[im 25/74  brain]
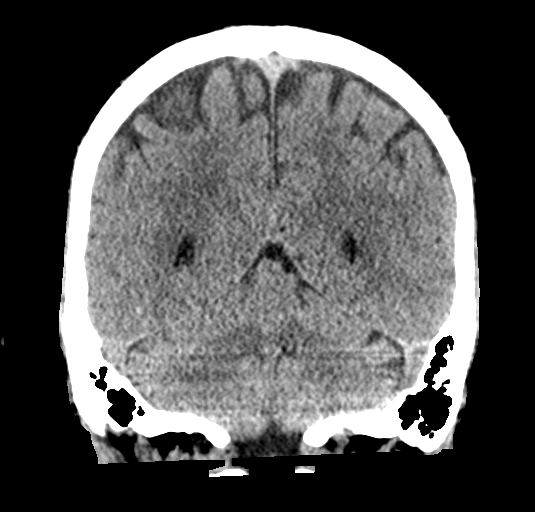
[im 33/74  brain]
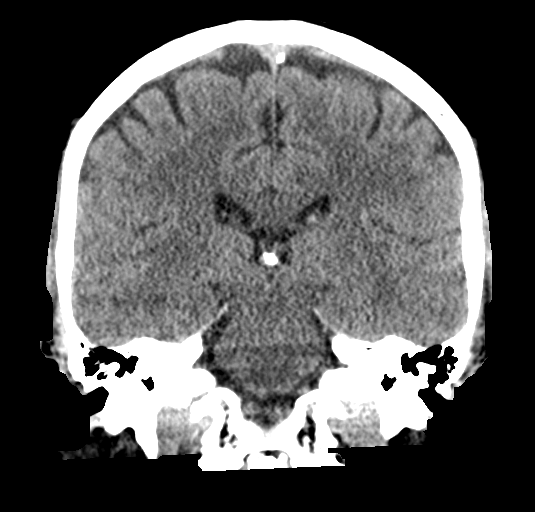
[im 41/74  brain]
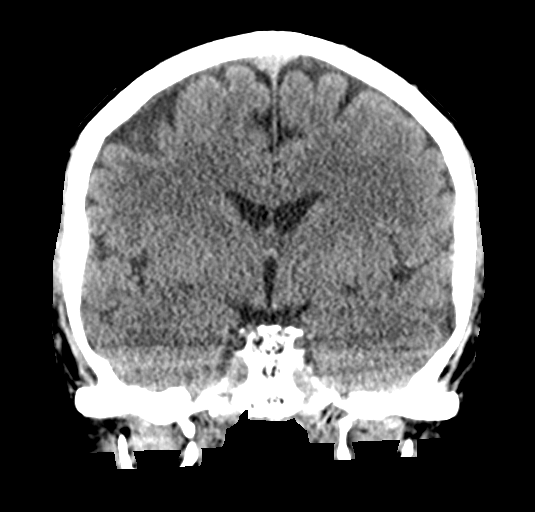

[Series 5: sagittal soft · sagittal · 0.35mm/px · 3 of 61 slices shown]
[im 21/61  brain]
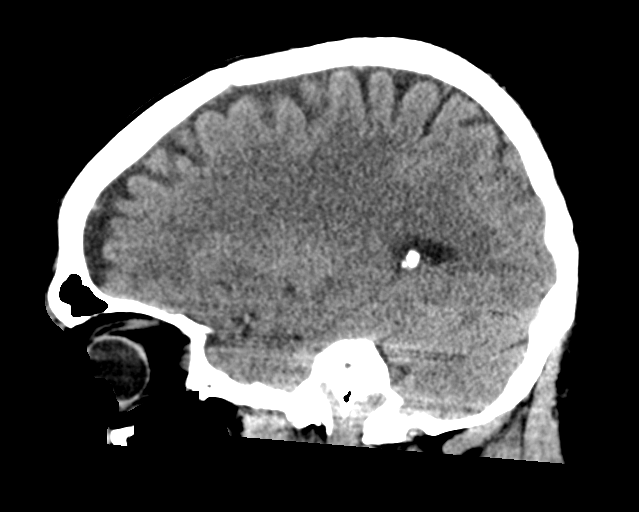
[im 31/61  brain]
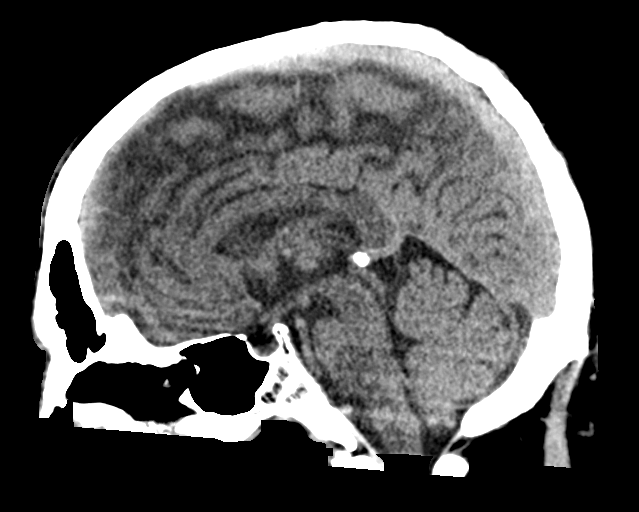
[im 41/61  brain]
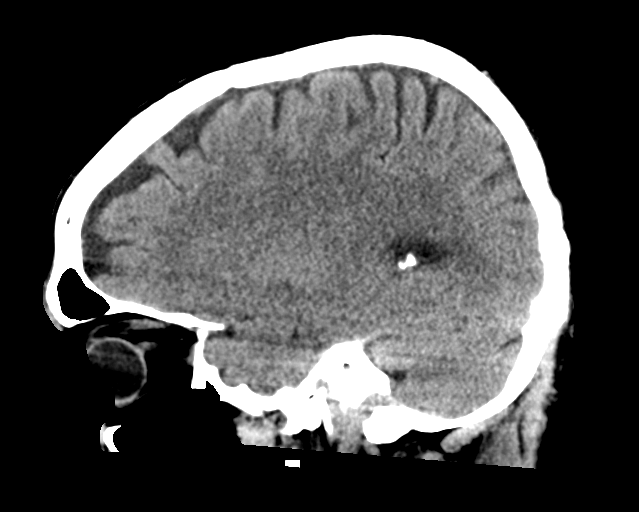

[17 of 47 positions shown; findings below may reference images not displayed]

FINDINGS: Brain: No evidence of acute infarction, hemorrhage, hydrocephalus,
extra-axial collection or mass lesion/mass effect.

Vascular: No hyperdense vessel or unexpected calcification.

Skull: Normal. Negative for fracture or focal lesion.

Sinuses/Orbits: No acute finding.

Other: None.
IMPRESSION: 1. No acute intracranial abnormalities. The appearance of the brain
is normal.
